# Patient Record
Sex: Male | Born: 2000 | Race: Black or African American | Hispanic: No | Marital: Single
Health system: Southern US, Community
[De-identification: ages and names within clinical notes are randomized; demographics above are authoritative.]

---

## 2019-09-23 ENCOUNTER — Emergency Department (HOSPITAL_COMMUNITY): Payer: Self-pay

## 2019-09-23 ENCOUNTER — Emergency Department (HOSPITAL_COMMUNITY)
Admission: EM | Admit: 2019-09-23 | Discharge: 2019-09-23 | Disposition: A | Discharge status: Home / Self Care | Payer: Self-pay | Attending: Emergency Medicine | Admitting: Emergency Medicine

## 2019-09-23 ENCOUNTER — Other Ambulatory Visit: Payer: Self-pay

## 2019-09-23 DIAGNOSIS — F10129 Alcohol abuse with intoxication, unspecified: Secondary | ICD-10-CM | POA: Insufficient documentation

## 2019-09-23 DIAGNOSIS — F10920 Alcohol use, unspecified with intoxication, uncomplicated: Secondary | ICD-10-CM

## 2019-09-23 LAB — CBC WITH DIFFERENTIAL/PLATELET
Abs Immature Granulocytes: 0.09 10*3/uL — ABNORMAL HIGH (ref 0.00–0.07)
Basophils Absolute: 0 10*3/uL (ref 0.0–0.1)
Basophils Relative: 0 %
Eosinophils Absolute: 0.1 10*3/uL (ref 0.0–0.5)
Eosinophils Relative: 1 %
HCT: 43.4 % (ref 39.0–52.0)
Hemoglobin: 14.3 g/dL (ref 13.0–17.0)
Immature Granulocytes: 1 %
Lymphocytes Relative: 9 %
Lymphs Abs: 0.7 10*3/uL (ref 0.7–4.0)
MCH: 30.6 pg (ref 26.0–34.0)
MCHC: 32.9 g/dL (ref 30.0–36.0)
MCV: 92.7 fL (ref 80.0–100.0)
Monocytes Absolute: 0.4 10*3/uL (ref 0.1–1.0)
Monocytes Relative: 5 %
Neutro Abs: 6.6 10*3/uL (ref 1.7–7.7)
Neutrophils Relative %: 84 %
Platelets: 183 10*3/uL (ref 150–400)
RBC: 4.68 MIL/uL (ref 4.22–5.81)
RDW: 13.2 % (ref 11.5–15.5)
WBC: 7.9 10*3/uL (ref 4.0–10.5)
nRBC: 0 % (ref 0.0–0.2)

## 2019-09-23 LAB — COMPREHENSIVE METABOLIC PANEL
ALT: 21 U/L (ref 0–44)
AST: 27 U/L (ref 15–41)
Albumin: 4.6 g/dL (ref 3.5–5.0)
Alkaline Phosphatase: 64 U/L (ref 38–126)
Anion gap: 10 (ref 5–15)
BUN: 8 mg/dL (ref 6–20)
CO2: 24 mmol/L (ref 22–32)
Calcium: 8.8 mg/dL — ABNORMAL LOW (ref 8.9–10.3)
Chloride: 108 mmol/L (ref 98–111)
Creatinine, Ser: 1.19 mg/dL (ref 0.61–1.24)
GFR calc Af Amer: >60 mL/min (ref >60)
GFR calc non Af Amer: >60 mL/min (ref >60)
Glucose, Bld: 126 mg/dL — ABNORMAL HIGH (ref 70–99)
Potassium: 4.1 mmol/L (ref 3.5–5.1)
Sodium: 142 mmol/L (ref 135–145)
Total Bilirubin: 0.3 mg/dL (ref 0.3–1.2)
Total Protein: 7.2 g/dL (ref 6.5–8.1)

## 2019-09-23 LAB — ETHANOL: Alcohol, Ethyl (B): 157 mg/dL — ABNORMAL HIGH (ref <10)

## 2019-09-23 LAB — SALICYLATE LEVEL: Salicylate Lvl: <7 mg/dL — ABNORMAL LOW (ref 7.0–30.0)

## 2019-09-23 LAB — ACETAMINOPHEN LEVEL: Acetaminophen (Tylenol), Serum: <10 ug/mL — ABNORMAL LOW (ref 10–30)

## 2019-09-23 MED ORDER — LACTATED RINGERS IV BOLUS
1000.0000 mL | Freq: Once | INTRAVENOUS | Status: AC
  Administered 2019-09-23: 1000 mL via INTRAVENOUS

## 2019-09-23 NOTE — ED Triage Notes (Signed)
Patient here for ETOH.

## 2019-09-23 NOTE — ED Provider Notes (Signed)
[facility]-EMERGENCY DEPT Provider Note   CSN: [id number] Arrival date & time: 09/23/19  9450     History Chief Complaint - vomiting  Level 5 caveat due to altered mental status  Andrew Shah is a 19 y.o. male.  The history is provided by the EMS personnel.  Emesis Severity:  Severe Timing:  Constant Chronicity:  New Relieved by:  Nothing Worsened by:  Nothing Per EMS, patient was found on the floor of a gas station actively vomiting.  It is presumed that he has alcohol intoxication There is a concern for possible aspiration. No signs of trauma.  No other details are known on arrival      PMH-unknown Soc hx - unknown Social History   Tobacco Use  . Smoking status: Not on file  Substance Use Topics  . Alcohol use: Not on file  . Drug use: Not on file    Home Medications Prior to Admission medications   Not on File    Allergies    Patient has no known allergies.  Review of Systems   Review of Systems  Unable to perform ROS: Mental status change  Gastrointestinal: Positive for vomiting.    Physical Exam Updated Vital Signs Ht 1.829 m (6')   Wt 83.9 kg   BMI 25.09 kg/m   Physical Exam  CONSTITUTIONAL: Disheveled, unresponsive HEAD: Normocephalic/atraumatic, no signs of trauma EYES: EOMI/PERRL ENMT: Mucous membranes moist, no signs of trauma NECK: supple no meningeal signs SPINE/BACK: No bruising/crepitance/stepoffs noted to spine CV: S1/S2 noted, no murmurs/rubs/gallops noted LUNGS: Lungs are clear to auscultation bilaterally, no apparent distress ABDOMEN: soft, nontender NEURO: Pt is somnolent and does not respond to voice. EXTREMITIES: pulses normal/equal, no signs of trauma SKIN: warm, color normal PSYCH: Unable to assess  ED Results / Procedures / Treatments   Labs (all labs ordered are listed, but only abnormal results are displayed) Labs Reviewed  CBC WITH DIFFERENTIAL/PLATELET - Abnormal; Notable for the following  components:      Result Value   Abs Immature Granulocytes 0.09 (*)    All other components within normal limits  COMPREHENSIVE METABOLIC PANEL - Abnormal; Notable for the following components:   Glucose, Bld 126 (*)    Calcium 8.8 (*)    All other components within normal limits  ACETAMINOPHEN LEVEL - Abnormal; Notable for the following components:   Acetaminophen (Tylenol), Serum <10 (*)    All other components within normal limits  ETHANOL - Abnormal; Notable for the following components:   Alcohol, Ethyl (B) 157 (*)    All other components within normal limits  SALICYLATE LEVEL - Abnormal; Notable for the following components:   Salicylate Lvl <7.0 (*)    All other components within normal limits  RAPID URINE DRUG SCREEN, HOSP PERFORMED    EKG EKG Interpretation  Date/Time:  Sunday September 23 2019 04:47:55 EDT Ventricular Rate:  94 PR Interval:    QRS Duration: 93 QT Interval:  373 QTC Calculation: 467 R Axis:   79 Text Interpretation: Sinus rhythm No previous ECGs available Confirmed by Zadie Rhine ([id number]) on 09/23/2019 4:51:20 AM   Radiology DG Chest Port 1 View  Result Date: 09/23/2019 CLINICAL DATA:  Shortness of breath. EXAM: PORTABLE CHEST 1 VIEW COMPARISON:  Short of breath.  Vomiting alcohol intoxication. FINDINGS: Normal heart size. No pleural effusion or edema. Lungs are clear. The visualized osseous structures appear unremarkable. IMPRESSION: Normal chest. Electronically Signed   By: Signa Kell M.D.   On: 09/23/2019 05:40  Procedures Procedures  Medications Ordered in ED Medications  lactated ringers bolus 1,000 mL (has no administration in time range)    ED Course  I have reviewed the triage vital signs and the nursing notes.  Pertinent labs & imaging results that were available during my care of the patient were reviewed by me and considered in my medical decision making (see chart for details).    MDM Rules/Calculators/A&P                           4:47 AM Patient presents after being found vomiting at a gas station.  Patient has presumed alcohol intoxication. Will check labs and reassess.  Patient is currently hemodynamically appropriate 6:26 AM Labs consistent with alcohol intoxication.  Chest x-ray was reviewed by myself and is negative Patient is now more arousable to voice and will move around the bed.  However he is not back to baseline. We will continue to monitor 7:28 AM Patient awake alert this time.  He is up moving around.  He has no new complaints.  Will be discharged home Final Clinical Impression(s) / ED Diagnoses Final diagnoses:  Alcoholic intoxication without complication [facility])    Rx / DC Orders ED Discharge Orders    None       Zadie Rhine, MD 09/23/19 [phone]

## 2019-09-23 NOTE — ED Triage Notes (Signed)
Patient found vomiting on the ground at a gas station actively vomiting.  ETOH, possible aspirations.  VS, 127/87,66,cbg 140, 97% ra.  16G LAC.

## 2019-09-23 NOTE — ED Notes (Signed)
Patient denies pain and is resting comfortably.  

## 2021-02-28 IMAGING — DX DG CHEST 1V PORT
1 series · 1 of 1 positions shown · non-contrast
Comparison: Short of breath.  Vomiting alcohol intoxication.

CLINICAL DATA: Shortness of breath.

EXAM:
PORTABLE CHEST 1 VIEW

[chest ap]
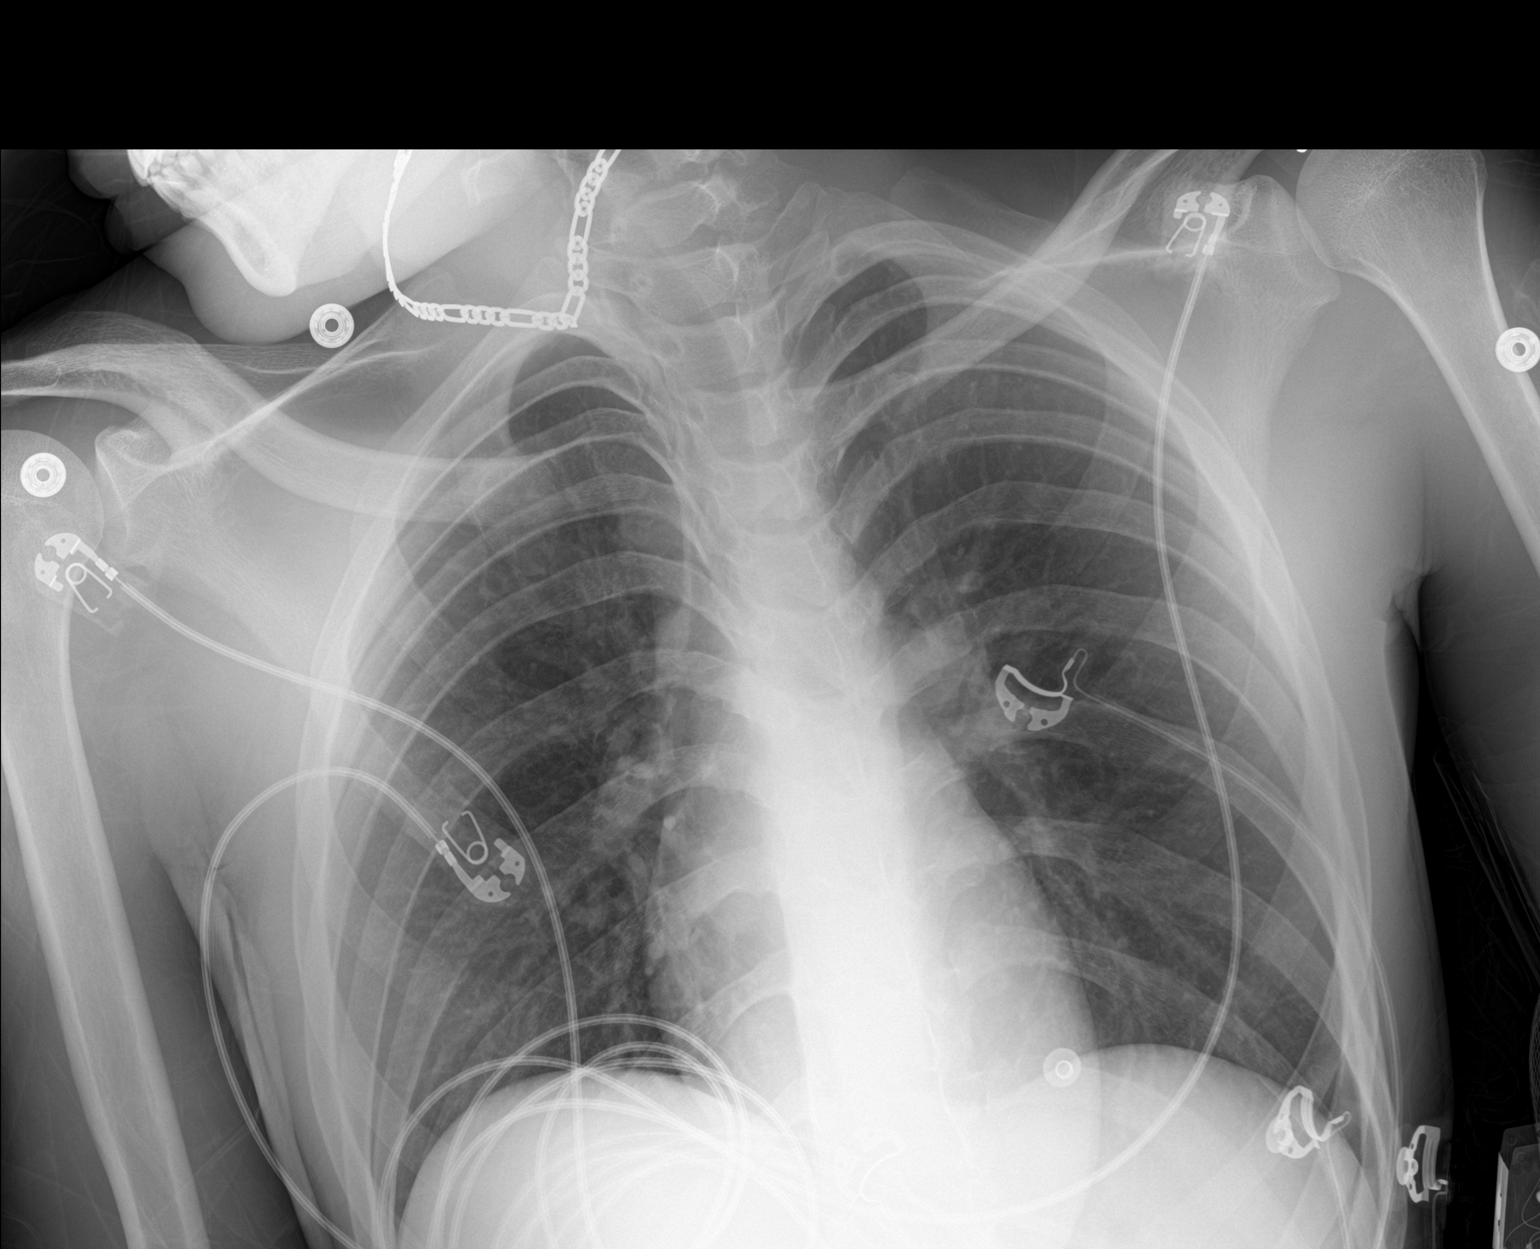

[1 of 1 positions shown; findings below may reference images not displayed]

FINDINGS: Normal heart size. No pleural effusion or edema. Lungs are clear.
The visualized osseous structures appear unremarkable.
IMPRESSION: Normal chest.
